# Patient Record
Sex: Female | Born: 1980 | Hispanic: Yes | Marital: Single | Smoking: Never smoker
Health system: Southern US, Community
[De-identification: ages and names within clinical notes are randomized; demographics above are authoritative.]

## PROBLEM LIST (undated history)

## (undated) DIAGNOSIS — N159 Renal tubulo-interstitial disease, unspecified: Secondary | ICD-10-CM

---

## 2007-05-02 ENCOUNTER — Ambulatory Visit: Payer: Self-pay

## 2007-06-20 ENCOUNTER — Ambulatory Visit: Payer: Self-pay

## 2007-12-16 ENCOUNTER — Inpatient Hospital Stay: Payer: Self-pay

## 2009-11-22 ENCOUNTER — Inpatient Hospital Stay: Payer: Self-pay | Admitting: Internal Medicine

## 2012-08-13 ENCOUNTER — Ambulatory Visit: Payer: Self-pay | Admitting: Family Medicine

## 2014-11-16 ENCOUNTER — Emergency Department: Payer: Self-pay | Admitting: Emergency Medicine

## 2016-08-08 ENCOUNTER — Encounter: Payer: Self-pay | Admitting: Emergency Medicine

## 2016-08-08 ENCOUNTER — Emergency Department
Admission: EM | Admit: 2016-08-08 | Discharge: 2016-08-09 | Disposition: A | Discharge status: Home / Self Care | Payer: Self-pay | Attending: Emergency Medicine | Admitting: Emergency Medicine

## 2016-08-08 DIAGNOSIS — R1033 Periumbilical pain: Secondary | ICD-10-CM

## 2016-08-08 HISTORY — DX: Renal tubulo-interstitial disease, unspecified: N15.9

## 2016-08-08 LAB — COMPREHENSIVE METABOLIC PANEL
ALBUMIN: 4 g/dL (ref 3.5–5.0)
ALK PHOS: 71 U/L (ref 38–126)
ALT: 21 U/L (ref 14–54)
AST: 22 U/L (ref 15–41)
Anion gap: 3 — ABNORMAL LOW (ref 5–15)
BILIRUBIN TOTAL: 0.4 mg/dL (ref 0.3–1.2)
BUN: 14 mg/dL (ref 6–20)
CALCIUM: 8.9 mg/dL (ref 8.9–10.3)
CO2: 31 mmol/L (ref 22–32)
CREATININE: 0.81 mg/dL (ref 0.44–1.00)
Chloride: 103 mmol/L (ref 101–111)
GFR calc Af Amer: >60 mL/min (ref >60)
GLUCOSE: 98 mg/dL (ref 65–99)
Potassium: 3.2 mmol/L — ABNORMAL LOW (ref 3.5–5.1)
Sodium: 137 mmol/L (ref 135–145)
TOTAL PROTEIN: 7.5 g/dL (ref 6.5–8.1)

## 2016-08-08 LAB — CBC
HEMATOCRIT: 43.3 % (ref 35.0–47.0)
Hemoglobin: 14.7 g/dL (ref 12.0–16.0)
MCH: 29.9 pg (ref 26.0–34.0)
MCHC: 34 g/dL (ref 32.0–36.0)
MCV: 88 fL (ref 80.0–100.0)
PLATELETS: 393 10*3/uL (ref 150–440)
RBC: 4.92 MIL/uL (ref 3.80–5.20)
RDW: 12.3 % (ref 11.5–14.5)
WBC: 12.8 10*3/uL — AB (ref 3.6–11.0)

## 2016-08-08 LAB — URINALYSIS COMPLETE WITH MICROSCOPIC (ARMC ONLY)
BILIRUBIN URINE: NEGATIVE
GLUCOSE, UA: NEGATIVE mg/dL
KETONES UR: NEGATIVE mg/dL
LEUKOCYTES UA: NEGATIVE
NITRITE: NEGATIVE
Protein, ur: NEGATIVE mg/dL
Specific Gravity, Urine: 1.009 (ref 1.005–1.030)
WBC, UA: NONE SEEN WBC/hpf (ref 0–5)
pH: 7 (ref 5.0–8.0)

## 2016-08-08 LAB — POCT PREGNANCY, URINE: Preg Test, Ur: NEGATIVE

## 2016-08-08 LAB — LIPASE, BLOOD: Lipase: 25 U/L (ref 11–51)

## 2016-08-08 NOTE — ED Triage Notes (Signed)
Pt presents to ED with epigastric abd pain all day today. Pt states she had similar symptoms previously and was not evaluated by an MD. Denies nausea or vomiting. Nothing seems to make pain improve or become worse. abd not tender with palpation. Pt alert and calm at this time. No distress noted.

## 2016-08-09 ENCOUNTER — Emergency Department: Payer: Self-pay

## 2016-08-09 ENCOUNTER — Encounter: Payer: Self-pay | Admitting: Radiology

## 2016-08-09 MED ORDER — IOPAMIDOL (ISOVUE-300) INJECTION 61%
100.0000 mL | Freq: Once | INTRAVENOUS | Status: AC | PRN
  Administered 2016-08-09: 100 mL via INTRAVENOUS

## 2016-08-09 MED ORDER — SODIUM CHLORIDE 0.9 % IV BOLUS (SEPSIS)
1000.0000 mL | Freq: Once | INTRAVENOUS | Status: AC
  Administered 2016-08-09: 1000 mL via INTRAVENOUS

## 2016-08-09 MED ORDER — TRAMADOL HCL 50 MG PO TABS: 50.0000 mg | ORAL_TABLET | Freq: Four times a day (QID) | ORAL | 0 refills | Status: DC | PRN

## 2016-08-09 MED ORDER — ONDANSETRON HCL 4 MG/2ML IJ SOLN
4.0000 mg | Freq: Once | INTRAMUSCULAR | Status: AC
  Administered 2016-08-09: 4 mg via INTRAVENOUS
  Filled 2016-08-09: qty 2

## 2016-08-09 MED ORDER — GI COCKTAIL ~~LOC~~
30.0000 mL | Freq: Once | ORAL | Status: AC
  Administered 2016-08-09: 30 mL via ORAL
  Filled 2016-08-09: qty 30

## 2016-08-09 MED ORDER — IOPAMIDOL (ISOVUE-300) INJECTION 61%
15.0000 mL | INTRAVENOUS | Status: AC
  Administered 2016-08-09: 15 mL via ORAL

## 2016-08-09 MED ORDER — MORPHINE SULFATE (PF) 4 MG/ML IV SOLN
4.0000 mg | Freq: Once | INTRAVENOUS | Status: AC
  Administered 2016-08-09: 4 mg via INTRAVENOUS
  Filled 2016-08-09: qty 1

## 2016-08-09 MED ORDER — FAMOTIDINE 40 MG PO TABS: 40.0000 mg | ORAL_TABLET | Freq: Every evening | ORAL | 0 refills | Status: AC

## 2016-08-09 NOTE — ED Provider Notes (Signed)
[facility] Emergency Department Provider Note   ____________________________________________   First MD Initiated Contact with Patient 08/09/16 0003     (approximate)  I have reviewed the triage vital signs and the nursing notes.   HISTORY  Chief Complaint Abdominal Pain    HPI Jo Carson is a 35 y.o. female comes into the hospital today with abdominal pain. The patient's pain started today. She reports that the pain is in her mid abdomen. She has not taken anything for pain. The patient denies nausea vomiting or diarrhea. She's had no pain with urination and no vaginal discharge. The patient's last menstrual period was 07/23/16. Her menstruation was normal. The patient is a G2 P2. The patient rates her pain a 9 out of 10 in intensity reports that radiates into her back. She has had this pain before about 2 weeks ago but reports that it was only a little bit. Today the pain is worse. The patient denies any lightheadedness or dizziness and denies any chest pain or shortness of breath. She is here today for evaluation of her pain.   Past Medical History:  Diagnosis Date  . Kidney infection     There are no active problems to display for this patient.   History reviewed. No pertinent surgical history.  Prior to Admission medications   Medication Sig Start Date End Date Taking? Authorizing Provider  famotidine (PEPCID) 40 MG tablet Take 1 tablet (40 mg total) by mouth every evening. 08/09/16 08/09/17  Rebecka ApleyAllison P Lucas Exline, MD  traMADol (ULTRAM) 50 MG tablet Take 1 tablet (50 mg total) by mouth every 6 (six) hours as needed. 08/09/16   Rebecka ApleyAllison P Elijah Michaelis, MD    Allergies Other  No family history on file.  Social History Social History  Substance Use Topics  . Smoking status: Never Smoker  . Smokeless tobacco: Never Used  . Alcohol use No    Review of Systems Constitutional: No fever/chills Eyes: No visual changes. ENT: No sore  throat. Cardiovascular: Denies chest pain. Respiratory: Denies shortness of breath. Gastrointestinal: abdominal pain.  No nausea, no vomiting.  No diarrhea.  No constipation. Genitourinary: Negative for dysuria. Musculoskeletal: Negative for back pain. Skin: Negative for rash. Neurological: Negative for headaches, focal weakness or numbness.  10-point ROS otherwise negative.  ____________________________________________   PHYSICAL EXAM:  VITAL SIGNS: ED Triage Vitals  Enc Vitals Group     BP 08/08/16 2115 125/75     Pulse Rate 08/08/16 2115 88     Resp 08/08/16 2115 18     Temp 08/08/16 2115 98.3 F (36.8 C)     Temp Source 08/08/16 2115 Oral     SpO2 08/08/16 2115 100 %     Weight 08/08/16 2119 115 lb (52.2 kg)     Height 08/08/16 2119 5\' 4"  (1.626 m)     Head Circumference --      Peak Flow --      Pain Score 08/08/16 2115 10     Pain Loc --      Pain Edu? --      Excl. in GC? --     Constitutional: Alert and oriented. Well appearing and in Mild distress. Eyes: Conjunctivae are normal. PERRL. EOMI. Head: Atraumatic. Nose: No congestion/rhinnorhea. Mouth/Throat: Mucous membranes are moist.  Oropharynx non-erythematous. Cardiovascular: Normal rate, regular rhythm. Grossly normal heart sounds.  Good peripheral circulation. Respiratory: Normal respiratory effort.  No retractions. Lungs CTAB. Gastrointestinal: Soft with some periumbilical tenderness to palpation. No distention.  Positive bowel sounds Musculoskeletal: No lower extremity tenderness nor edema.   Neurologic:  Normal speech and language. Skin:  Skin is warm, dry and intact.  Psychiatric: Mood and affect are normal.   ____________________________________________   LABS (all labs ordered are listed, but only abnormal results are displayed)  Labs Reviewed  COMPREHENSIVE METABOLIC PANEL - Abnormal; Notable for the following:       Result Value   Potassium 3.2 (*)    Anion gap 3 (*)    All other  components within normal limits  CBC - Abnormal; Notable for the following:    WBC 12.8 (*)    All other components within normal limits  URINALYSIS COMPLETEWITH MICROSCOPIC ([facility] ONLY) - Abnormal; Notable for the following:    Color, Urine STRAW (*)    APPearance CLEAR (*)    Hgb urine dipstick 1+ (*)    Bacteria, UA RARE (*)    Squamous Epithelial / LPF 0-5 (*)    All other components within normal limits  LIPASE, BLOOD  POC URINE PREG, ED  POCT PREGNANCY, URINE   ____________________________________________  EKG  None ____________________________________________  RADIOLOGY  CT abdomen and pelvis ____________________________________________   PROCEDURES  Procedure(s) performed: None  Procedures  Critical Care performed: No  ____________________________________________   INITIAL IMPRESSION / ASSESSMENT AND PLAN / ED COURSE  Pertinent labs & imaging results that were available during my care of the patient were reviewed by me and considered in my medical decision making (see chart for details).  This is a 35 year old female who comes into the hospital today with some mid abdominal pain. She rates her pain a 9 out of 10 in intensity. She has not had any secondary symptoms at his vomiting fevers diarrhea or constipation. The patient reports though that she is very uncomfortable. Her blood work does have a mildly elevated white blood cell count at 12. I will give the patient a dose of morphine and Zofran and I will send the patient for a CT scan to evaluate the possible cause of her pain. The patient otherwise has no further concerns. I will reassess the patient when she's receive her medication and her CT scan. She will also receive a liter of normal saline.  Clinical Course  Value Comment By Time  CT Abdomen Pelvis W Contrast No acute process demonstrated in the abdomen or pelvis. No evidence of bowel obstruction or inflammation. Probable physiologic changes in the  left ovary.   Rebecka Apley, MD 09/19 0231    The patient's CT scan is unremarkable. I will discharge the patient home to have her follow-up with her primary care physician. She reports that she goes to [employer]. The patient will be given some medicine for home to help with her pain. ____________________________________________   FINAL CLINICAL IMPRESSION(S) / ED DIAGNOSES  Final diagnoses:  Periumbilical abdominal pain      NEW MEDICATIONS STARTED DURING THIS VISIT:  New Prescriptions   FAMOTIDINE (PEPCID) 40 MG TABLET    Take 1 tablet (40 mg total) by mouth every evening.   TRAMADOL (ULTRAM) 50 MG TABLET    Take 1 tablet (50 mg total) by mouth every 6 (six) hours as needed.     Note:  This document was prepared using Dragon voice recognition software and may include unintentional dictation errors.    Rebecka Apley, MD 08/09/16 [phone]

## 2016-08-09 NOTE — ED Notes (Signed)
Patient transported to CT 

## 2016-11-25 IMAGING — CT CT ABD-PELV W/ CM
2 of 4 series · 16 of 46 positions shown, 18 images · IV contrast (APPLIED)
Comparison: None.

CLINICAL DATA: Epigastric abdominal pain all day.

EXAM:
CT ABDOMEN AND PELVIS WITH CONTRAST
TECHNIQUE: Multidetector CT imaging of the abdomen and pelvis was performed
using the standard protocol following bolus administration of
intravenous contrast.
CONTRAST:  100mL Z18DRF-EOO IOPAMIDOL (Z18DRF-EOO) INJECTION 61%

[Series 2: axial st · axial · 0.77mm/px · z∈[-445,-25]mm · 13 of 92 slices shown, 15 images]
[im 4/92  soft-tissue]
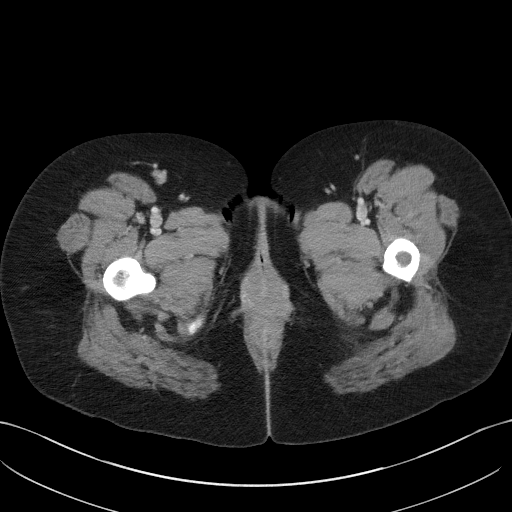
[im 4/92  bone]
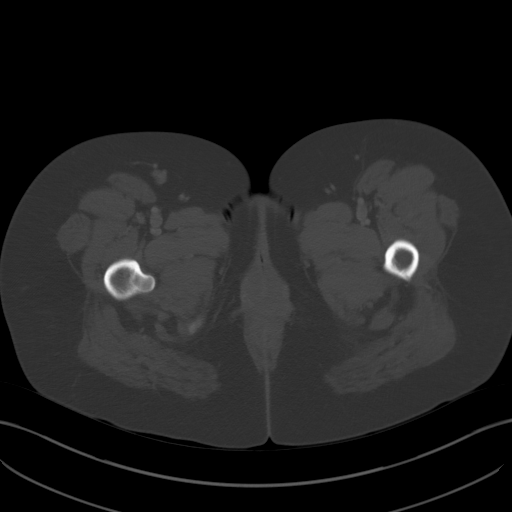
[im 12/92  soft-tissue]
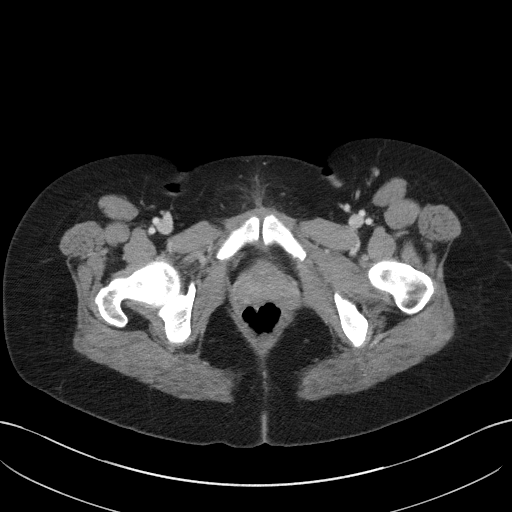
[im 20/92  soft-tissue]
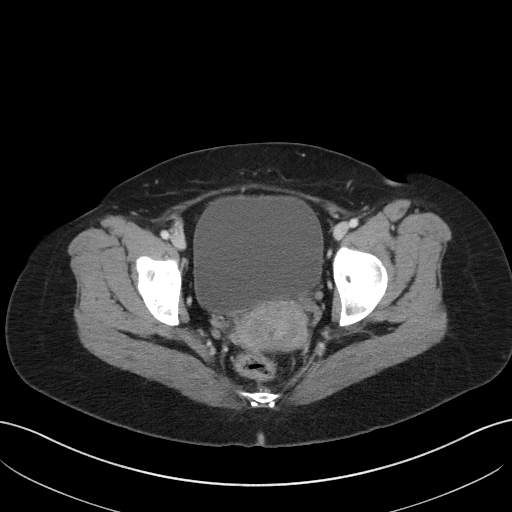
[im 24/92  soft-tissue]
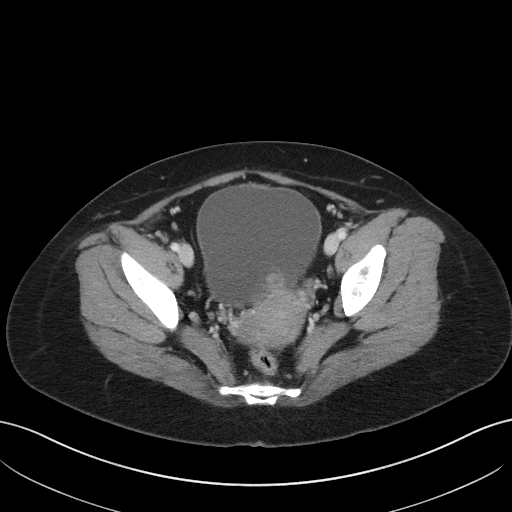
[im 32/92  soft-tissue]
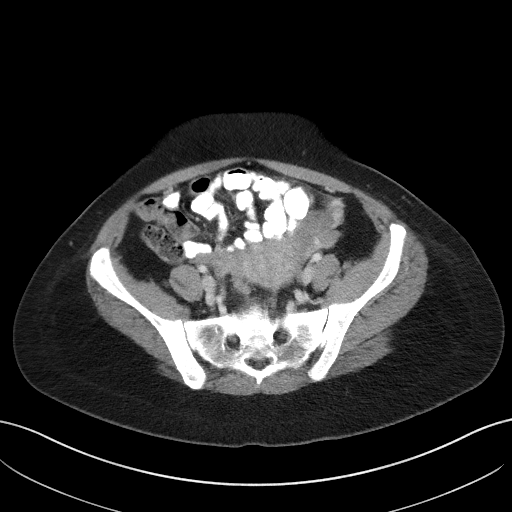
[im 40/92  soft-tissue]
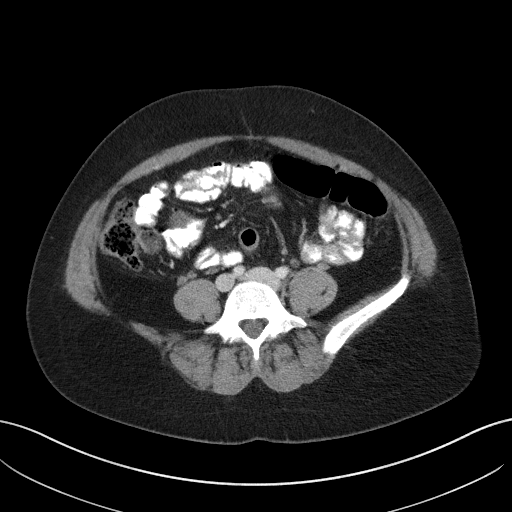
[im 48/92  soft-tissue]
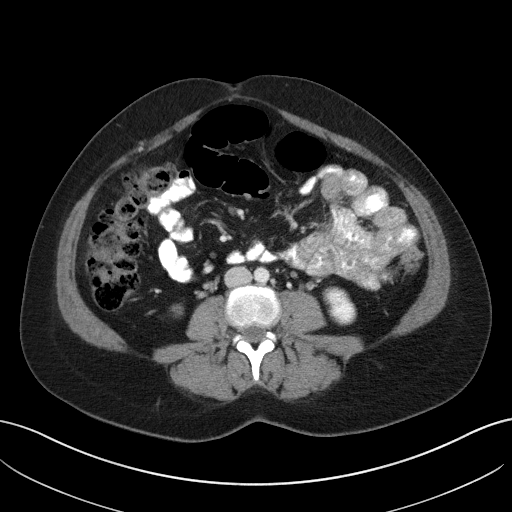
[im 52/92  soft-tissue]
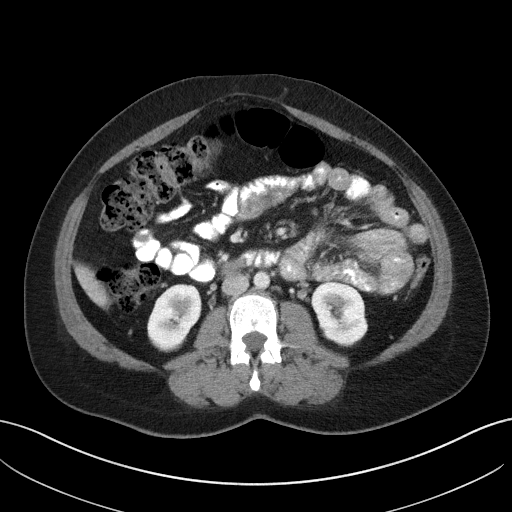
[im 60/92  soft-tissue]
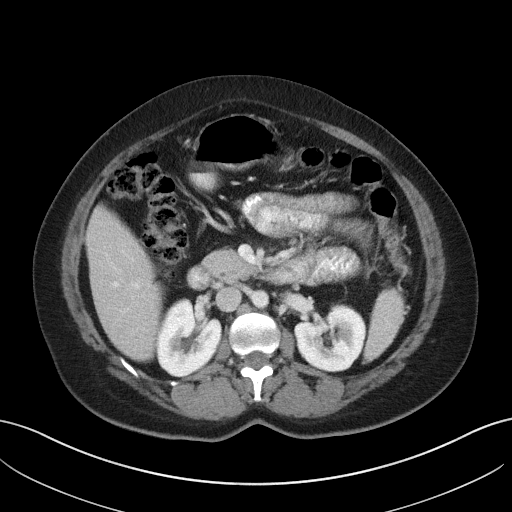
[im 60/92  bone]
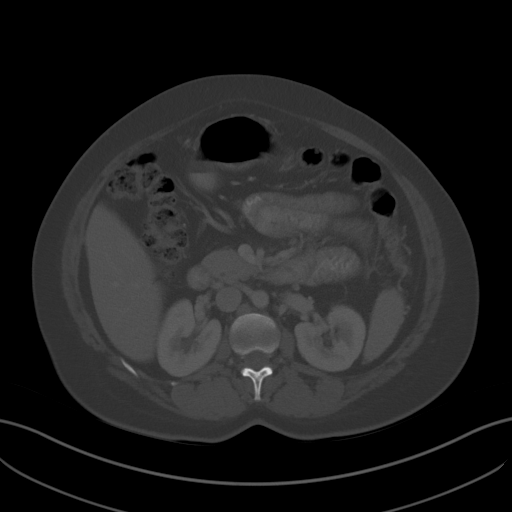
[im 68/92  soft-tissue]
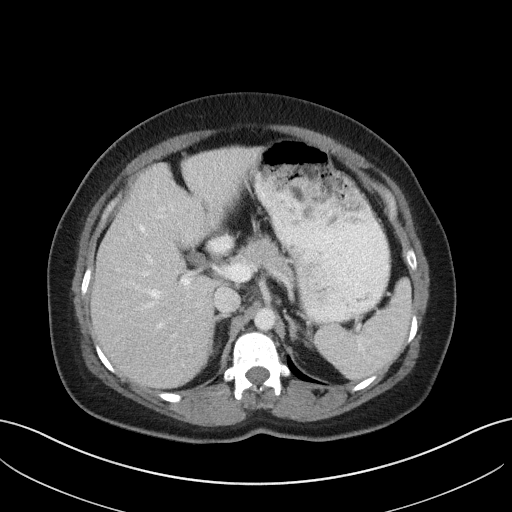
[im 72/92  soft-tissue]
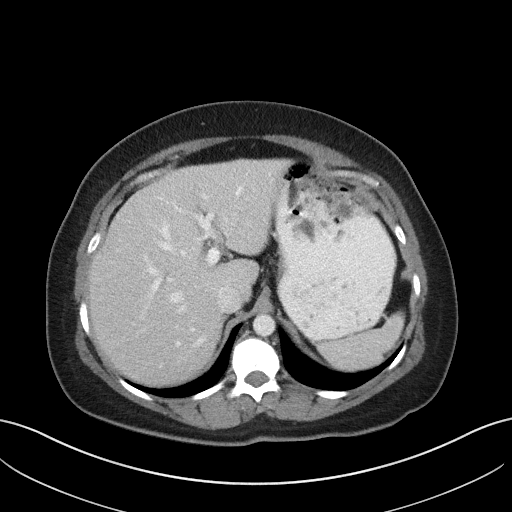
[im 80/92  soft-tissue]
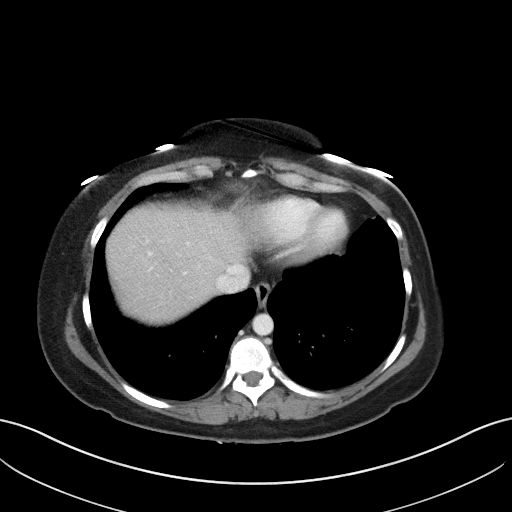
[im 88/92  soft-tissue]
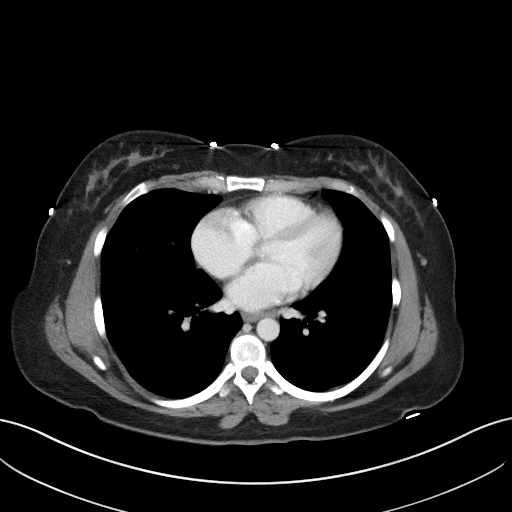

[Series 5: coronal st · coronal · 0.77mm/px · 3 of 93 slices shown]
[im 31/93  soft-tissue]
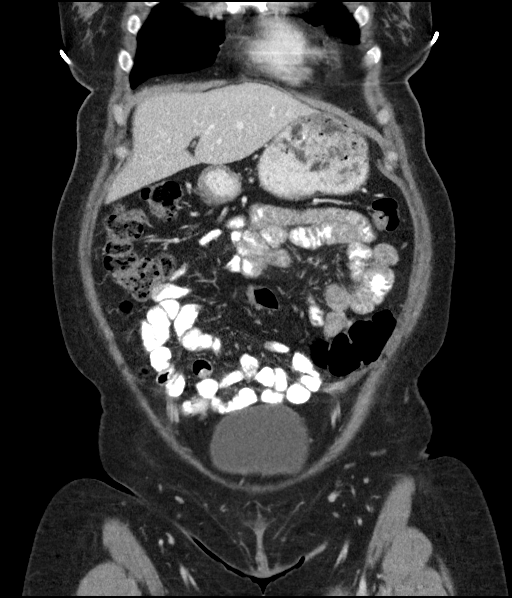
[im 41/93  soft-tissue]
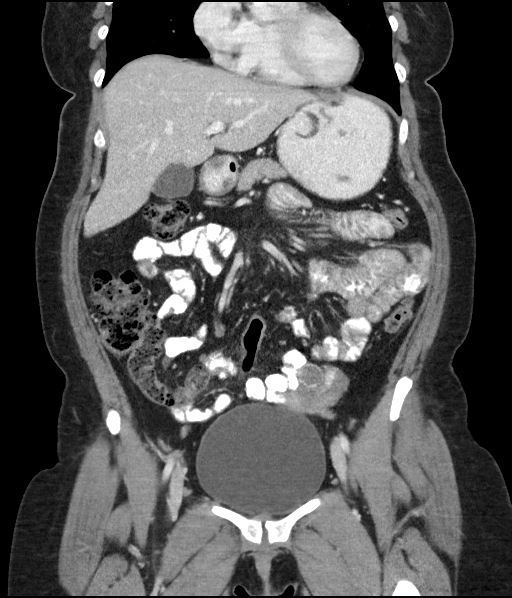
[im 52/93  soft-tissue]
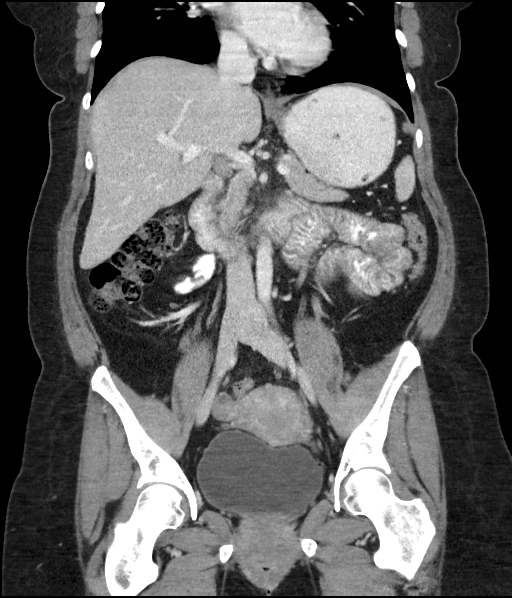

[16 of 46 positions shown; findings below may reference images not displayed]

FINDINGS: Lower chest: No acute abnormality.

Hepatobiliary: No focal liver abnormality is seen. No gallstones,
gallbladder wall thickening, or biliary dilatation.

Pancreas: Unremarkable. No pancreatic ductal dilatation or
surrounding inflammatory changes.

Spleen: Normal in size without focal abnormality.

Adrenals/Urinary Tract: Adrenal glands are unremarkable. Kidneys are
normal, without renal calculi, focal lesion, or hydronephrosis.
Bladder is unremarkable.

Stomach/Bowel: Stomach is within normal limits. Appendix appears
normal. No evidence of bowel wall thickening, distention, or
inflammatory changes.

Vascular/Lymphatic: No significant vascular findings are present. No
enlarged abdominal or pelvic lymph nodes.

Reproductive: Left ovary is somewhat nodular in appearance probably
representing follicular cystic changes. No definite abnormal adnexal
mass. Uterus appears normal.

Other: No abdominal wall hernia or abnormality. No abdominopelvic
ascites.

Musculoskeletal: No acute or significant osseous findings.
IMPRESSION: No acute process demonstrated in the abdomen or pelvis. No evidence
of bowel obstruction or inflammation. Probable physiologic changes
in the left ovary.

## 2016-12-14 ENCOUNTER — Encounter: Payer: Self-pay | Admitting: *Deleted

## 2016-12-14 ENCOUNTER — Emergency Department
Admission: EM | Admit: 2016-12-14 | Discharge: 2016-12-14 | Disposition: A | Discharge status: Home / Self Care | Payer: Self-pay | Attending: Emergency Medicine | Admitting: Emergency Medicine

## 2016-12-14 DIAGNOSIS — L600 Ingrowing nail: Secondary | ICD-10-CM | POA: Insufficient documentation

## 2016-12-14 MED ORDER — SULFAMETHOXAZOLE-TRIMETHOPRIM 800-160 MG PO TABS: 1.0000 | ORAL_TABLET | Freq: Two times a day (BID) | ORAL | 0 refills | Status: AC

## 2016-12-14 MED ORDER — TRAMADOL HCL 50 MG PO TABS: 50.0000 mg | ORAL_TABLET | Freq: Four times a day (QID) | ORAL | 0 refills | Status: AC | PRN

## 2016-12-14 NOTE — ED Provider Notes (Signed)
[facility] Emergency Department Provider Note  ____________________________________________  Time seen: Approximately 8:08 AM  I have reviewed the triage vital signs and the nursing notes.   HISTORY  Chief Complaint Nail Problem   HPI Jo Carson is a 36 y.o. female who presents to the emergency department for evaluation of an ingrown toenail to the left great toe. She states that she has had problems for a long time and has had to have the nail cut out in the past. She has had purulent drainage and pain for the past few days. She has applied antibiotic ointment without relief.  Past Medical History:  Diagnosis Date  . Kidney infection     There are no active problems to display for this patient.   History reviewed. No pertinent surgical history.  Prior to Admission medications   Medication Sig Start Date End Date Taking? Authorizing Provider  famotidine (PEPCID) 40 MG tablet Take 1 tablet (40 mg total) by mouth every evening. 08/09/16 08/09/17  Rebecka Apley, MD  sulfamethoxazole-trimethoprim (BACTRIM DS,SEPTRA DS) 800-160 MG tablet Take 1 tablet by mouth 2 (two) times daily. 12/14/16   Chinita Pester, FNP  traMADol (ULTRAM) 50 MG tablet Take 1 tablet (50 mg total) by mouth every 6 (six) hours as needed. 12/14/16   Chinita Pester, FNP    Allergies Other  History reviewed. No pertinent family history.  Social History Social History  Substance Use Topics  . Smoking status: Never Smoker  . Smokeless tobacco: Never Used  . Alcohol use No    Review of Systems  Constitutional: Negative for fever/chills Respiratory: Negative for shortness of breath. Musculoskeletal: Positive for pain. Skin: Positive for purulent drainage from left great toe Neurological: Negative for headaches, focal weakness or numbness. ____________________________________________   PHYSICAL EXAM:  VITAL SIGNS: ED Triage Vitals  Enc Vitals Group     BP 12/14/16  0744 129/70     Pulse Rate 12/14/16 0744 94     Resp 12/14/16 0744 18     Temp 12/14/16 0744 98.1 F (36.7 C)     Temp Source 12/14/16 0744 Oral     SpO2 12/14/16 0744 99 %     Weight 12/14/16 0744 115 lb (52.2 kg)     Height 12/14/16 0744 5\' 2"  (1.575 m)     Head Circumference --      Peak Flow --      Pain Score 12/14/16 0743 9     Pain Loc --      Pain Edu? --      Excl. in GC? --      Constitutional: Alert and oriented. Well appearing and in no acute distress. Eyes: Conjunctivae are normal. EOMI. Nose: No congestion/rhinnorhea. Mouth/Throat: Mucous membranes are moist.   Neck: No stridor. Cardiovascular: Good peripheral circulation. Respiratory: Normal respiratory effort.  No retractions. Musculoskeletal: FROM throughout. Neurologic:  Normal speech and language. No gross focal neurologic deficits are appreciated. Skin:  Left lateral edge of the skin fold at nailbed with purulent drainage present. The nail is extremely hyperkeratotic. Surrounding skin is without fluctuance or erythema.  ____________________________________________   LABS (all labs ordered are listed, but only abnormal results are displayed)  Labs Reviewed - No data to display ____________________________________________  EKG   ____________________________________________  RADIOLOGY  Not indicated ____________________________________________   PROCEDURES  Procedure(s) performed: None ____________________________________________   INITIAL IMPRESSION / ASSESSMENT AND PLAN / ED COURSE     Pertinent labs & imaging results that were  available during my care of the patient were reviewed by me and considered in my medical decision making (see chart for details).  36 year old female presenting to the emergency department for evaluation and treatment of an ingrown toenail on the left great toe. Nail was not removed today due to to possibility of complications due to the thickness of the nail and  previous history of poor response to local anesthesia of the great toe. Additionally, I do not have trioloricidic  acid to prevent the nail from regrowing. She'll be given a prescription for Bactrim and tramadol and advised to call and schedule an appointment with podiatry. She was also advised to soak the foot in warm Epson salt water 4 times per day. She was encouraged to return to the emergency department for symptoms that change or worsen if she is unable to see the primary care provider or podiatry. ____________________________________________   FINAL CLINICAL IMPRESSION(S) / ED DIAGNOSES  Final diagnoses:  Ingrowing nail, left great toe    Discharge Medication List as of 12/14/2016  8:12 AM    START taking these medications   Details  sulfamethoxazole-trimethoprim (BACTRIM DS,SEPTRA DS) 800-160 MG tablet Take 1 tablet by mouth 2 (two) times daily., Starting Wed 12/14/2016, Print        Note:  This document was prepared using Dragon voice recognition software and may include unintentional dictation errors.    Chinita PesterCari B Jayse Hodkinson, FNP 12/14/16 1551    Arnaldo NatalPaul F Malinda, MD 12/14/16 [phone]

## 2016-12-14 NOTE — ED Notes (Signed)
See triage note   Possible ingrown toenail to left great toe  No redness noted

## 2016-12-14 NOTE — ED Triage Notes (Signed)
Pt states ingrown toenail left great toe

## 2019-10-15 ENCOUNTER — Other Ambulatory Visit: Payer: Self-pay

## 2019-10-15 DIAGNOSIS — Z20822 Contact with and (suspected) exposure to covid-19: Secondary | ICD-10-CM

## 2019-10-17 LAB — NOVEL CORONAVIRUS, NAA: SARS-CoV-2, NAA: NOT DETECTED

## 2019-10-21 ENCOUNTER — Telehealth: Payer: Self-pay

## 2019-10-21 NOTE — Telephone Encounter (Signed)
°  Pt rec neg COVID  Results  °

## 2020-02-08 ENCOUNTER — Ambulatory Visit: Payer: Self-pay | Attending: Internal Medicine

## 2020-02-08 DIAGNOSIS — Z23 Encounter for immunization: Secondary | ICD-10-CM

## 2020-02-08 NOTE — Progress Notes (Signed)
   Covid-19 Vaccination Clinic  Name:  Jo Carson    MRN: [id number] DOB: 25-Jun-1981  02/08/2020  [name] was observed post Covid-19 immunization for 15 minutes without incident. She was provided with Vaccine Information Sheet and instruction to access the V-Safe system.   [name] was instructed to call 911 with any severe reactions post vaccine: Marland Kitchen Difficulty breathing  . Swelling of face and throat  . A fast heartbeat  . A bad rash all over body  . Dizziness and weakness   Immunizations Administered    Name Date Dose VIS Date Route   Pfizer COVID-19 Vaccine 02/08/2020  4:28 PM 0.3 mL 11/01/2019 Intramuscular   Manufacturer: [employer], [employer]   Lot: [id number]   NDC: [address]-2

## 2020-02-29 ENCOUNTER — Ambulatory Visit: Payer: Self-pay | Attending: Internal Medicine

## 2020-02-29 DIAGNOSIS — Z23 Encounter for immunization: Secondary | ICD-10-CM

## 2020-02-29 NOTE — Progress Notes (Signed)
   Covid-19 Vaccination Clinic  Name:  Jo Carson    MRN: [id number] DOB: 1981/08/19  02/29/2020  [name] was observed post Covid-19 immunization for 15 minutes without incident. She was provided with Vaccine Information Sheet and instruction to access the V-Safe system.   [name] was instructed to call 911 with any severe reactions post vaccine: Marland Kitchen Difficulty breathing  . Swelling of face and throat  . A fast heartbeat  . A bad rash all over body  . Dizziness and weakness   Immunizations Administered    Name Date Dose VIS Date Route   Pfizer COVID-19 Vaccine 02/29/2020  4:06 PM 0.3 mL 11/01/2019 Intramuscular   Manufacturer: [employer], [employer]   Lot: [id number]   NDC: [address]-6

## 2023-03-06 ENCOUNTER — Encounter: Payer: Self-pay | Admitting: Family Medicine

## 2023-03-15 ENCOUNTER — Other Ambulatory Visit: Payer: Self-pay

## 2023-03-15 DIAGNOSIS — Z1231 Encounter for screening mammogram for malignant neoplasm of breast: Secondary | ICD-10-CM

## 2023-04-10 ENCOUNTER — Ambulatory Visit
Admission: RE | Admit: 2023-04-10 | Discharge: 2023-04-10 | Disposition: A | Discharge status: Home / Self Care | Payer: Self-pay | Source: Ambulatory Visit | Attending: Obstetrics and Gynecology | Admitting: Obstetrics and Gynecology

## 2023-04-10 ENCOUNTER — Ambulatory Visit: Payer: Self-pay | Attending: Hematology and Oncology | Admitting: Hematology and Oncology

## 2023-04-10 VITALS — BP 123/82 | Wt 170.4 lb

## 2023-04-10 DIAGNOSIS — Z1231 Encounter for screening mammogram for malignant neoplasm of breast: Secondary | ICD-10-CM | POA: Insufficient documentation

## 2023-04-10 NOTE — Progress Notes (Signed)
[name] is a 42 y.o. female who presents to [facility] clinic today with complaint of left breast nodule/pain.    Pap Smear: Pap not smear completed today. Last Pap smear was 12/29/2020 at [facility] clinic and was normal. Per patient has no history of an abnormal Pap smear. Last Pap smear result is available in Epic.   Physical exam: Breasts Breasts symmetrical. No skin abnormalities bilateral breasts. No nipple retraction bilateral breasts. No nipple discharge bilateral breasts. No lymphadenopathy. No lumps palpated right breast. Left breast with firmness noted at 12 o'clock.       Pelvic/Bimanual Pap is not indicated today    Smoking History: Patient has never smoked and was not referred to quit line.    Patient Navigation: Patient education provided. Access to services provided for patient through [facility] program. Jo Carson interpreter provided. No transportation provided   Colorectal Cancer Screening: Per patient has never had colonoscopy completed No complaints today.    Breast and Cervical Cancer Risk Assessment: Patient does not have family history of breast cancer, known genetic mutations, or radiation treatment to the chest before age 65. Patient does not have history of cervical dysplasia, immunocompromised, or DES exposure in-utero.  Risk Assessment   No risk assessment data     A: [facility] exam without pap smear No complaints with benign exam.   P: [facility] to [facility] [place] for a screening mammogram. Appointment scheduled 04/14/2023.  Jo Lux, NP 04/10/2023 12:17 PM

## 2023-04-10 NOTE — Patient Instructions (Signed)
Taught [name] about self breast awareness and gave educational materials to take home. Patient did not need a Pap smear today due to last Pap smear was in 01/18/2021 per patient. Let her know [facility] will cover Pap smears every 5 years unless has a history of abnormal Pap smears. Referred patient to the [facility] [place] for screening mammogram. Appointment scheduled for 04/10/23. Patient aware of appointment and will be there. Let patient know will follow up with her within the next couple weeks with results. Jo Carson verbalized understanding.  Pascal Lux, NP 12:18 PM

## 2024-07-17 ENCOUNTER — Telehealth: Payer: Self-pay | Admitting: *Deleted

## 2024-08-06 ENCOUNTER — Other Ambulatory Visit: Payer: Self-pay | Admitting: Family Medicine

## 2024-08-06 DIAGNOSIS — Z1231 Encounter for screening mammogram for malignant neoplasm of breast: Secondary | ICD-10-CM

## 2024-09-06 ENCOUNTER — Ambulatory Visit
Admission: RE | Admit: 2024-09-06 | Discharge: 2024-09-06 | Disposition: A | Payer: Self-pay | Source: Ambulatory Visit | Attending: Family Medicine | Admitting: Family Medicine

## 2024-09-06 DIAGNOSIS — Z1231 Encounter for screening mammogram for malignant neoplasm of breast: Secondary | ICD-10-CM | POA: Insufficient documentation
